# Patient Record
Sex: Female | Born: 1995
Health system: Southern US, Community
[De-identification: ages and names within clinical notes are randomized; demographics above are authoritative.]

---
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
## 2015-12-27 ENCOUNTER — Encounter: Payer: Self-pay | Admitting: Primary Care

## 2015-12-27 ENCOUNTER — Ambulatory Visit (INDEPENDENT_AMBULATORY_CARE_PROVIDER_SITE_OTHER): Payer: Managed Care, Other (non HMO) | Admitting: Primary Care

## 2015-12-27 ENCOUNTER — Encounter: Payer: Self-pay | Admitting: *Deleted

## 2015-12-27 VITALS — BP 122/76 | HR 80 | Temp 98.0°F | Ht 62.0 in | Wt 185.8 lb

## 2015-12-27 DIAGNOSIS — M791 Myalgia, unspecified site: Secondary | ICD-10-CM | POA: Insufficient documentation

## 2015-12-27 DIAGNOSIS — G8929 Other chronic pain: Secondary | ICD-10-CM

## 2015-12-27 DIAGNOSIS — M25512 Pain in left shoulder: Secondary | ICD-10-CM | POA: Diagnosis not present

## 2015-12-27 LAB — COMPREHENSIVE METABOLIC PANEL
ALBUMIN: 4.6 g/dL (ref 3.5–5.2)
ALT: 36 U/L — ABNORMAL HIGH (ref 0–35)
AST: 24 U/L (ref 0–37)
Alkaline Phosphatase: 54 U/L (ref 39–117)
BUN: 15 mg/dL (ref 6–23)
CALCIUM: 9.7 mg/dL (ref 8.4–10.5)
CHLORIDE: 104 meq/L (ref 96–112)
CO2: 25 mEq/L (ref 19–32)
Creatinine, Ser: 0.79 mg/dL (ref 0.40–1.20)
GFR: 96.81 mL/min (ref >60.00)
Glucose, Bld: 110 mg/dL — ABNORMAL HIGH (ref 70–99)
POTASSIUM: 4 meq/L (ref 3.5–5.1)
Sodium: 139 mEq/L (ref 135–145)
Total Bilirubin: 0.4 mg/dL (ref 0.2–1.2)
Total Protein: 7.9 g/dL (ref 6.0–8.3)

## 2015-12-27 NOTE — Progress Notes (Signed)
Pre visit review using our clinic review tool, if applicable. No additional management support is needed unless otherwise documented below in the visit note. 

## 2015-12-27 NOTE — Patient Instructions (Signed)
You will be contacted regarding your MRI.  Please let us know if you have not heard back within one week.   Complete lab work prior to leaving today. I will notify you of your results once received.   Please schedule a physical with me in 6 months. You may also schedule a lab only appointment 3-4 days prior. We will discuss your lab results in detail during your physical.  It was a pleasure to meet you today! Please don't hesitate to call me with any questions. Welcome to [facility]!

## 2015-12-27 NOTE — Progress Notes (Signed)
Subjective:    Patient ID: Ashlee Cross, female    DOB: 1996-03-31, 20 y.o.   MRN: [id number]  HPI  Ms. Ashlee Cross is a 20 year old female who presents today to establish care and discuss the problems mentioned below. Will obtain old records.  1) Myalgias: Located to the right side of her thoracic back. Two years ago she experienced this discomfort every 3 months with any twisting or certain movement of her thoracic back. January of 2016 she noticed this discomfort occuring once monthly. She cannot replicate her discomfort with any movement.   Over the past several months she's noticed her discomfort more frequently. Each time she has this discomfort she will experience soreness to her muscle for several hours. Her discomfort does not occur with her menstrual cycle. Denies numbness/tingling, recent injury or trauma. She describes her discomfort as a "shock-like" pain.  2) Shoulder Injury: Located to left shoulder for the past 5-6 years. Competed in swimming, tennis, and golf during high school which is when she started to notice her pain. She was diagnosed with supraspinatus strain in high school. She completed physical therapy about 5 years ago with temporary improvement until her insurance no longer covered treatment. She has been frustrated recently as she is trying to work out and exercise, but cannot without pain and weakness. She has a history of hyperextension to her knees.   Review of Systems  Respiratory: Negative for shortness of breath.   Cardiovascular: Negative for chest pain.  Musculoskeletal: Positive for myalgias.       Left shoulder weakness and pain  Neurological: Negative for numbness.       No past medical history on file.   Social History   Social History  . Marital Status: Single    Spouse Name: N/A  . Number of Children: N/A  . Years of Education: N/A   Occupational History  . Not on file.   Social History Main Topics  . Smoking status: Never Smoker   .  Smokeless tobacco: Not on file  . Alcohol Use: 0.0 oz/week    0 Standard drinks or equivalent per week  . Drug Use: No  . Sexual Activity: Not on file   Other Topics Concern  . Not on file   Social History Narrative   Single.   Bachelor's degree in Bioengineering.    Plans on attending PT school in the Fall of 2018.   [place] spending time with friends, [place]    Past Surgical History  Procedure Laterality Date  . Wisdom tooth extraction  2014    Family History  Problem Relation Age of Onset  . Hyperlipidemia Father   . Hyperlipidemia Mother     Allergies  Allergen Reactions  . Vicodin [Hydrocodone-Acetaminophen] Nausea And Vomiting    No current outpatient prescriptions on file prior to visit.   No current facility-administered medications on file prior to visit.    BP 122/76 mmHg  Pulse 80  Temp(Src) 98 F (36.7 C) (Oral)  Ht 5\' 2"  (1.575 m)  Wt 185 lb 12.8 oz (84.278 kg)  BMI 33.97 kg/m2  SpO2 98%  LMP 12/25/2015    Objective:   Physical Exam  Constitutional: She appears well-nourished.  Neck: Neck supple.  Cardiovascular: Normal rate and regular rhythm.   Pulmonary/Chest: Effort normal and breath sounds normal.  Musculoskeletal:       Left shoulder: She exhibits pain and decreased strength. She exhibits normal range of motion, no tenderness  and no crepitus.  Pain and weakness with any resistance to left shoulder  Skin: Skin is warm and dry.  Psychiatric: She has a normal mood and affect.          Assessment & Plan:

## 2015-12-27 NOTE — Assessment & Plan Note (Signed)
Present for 5-6 years with pain and weakness. Temporary improvement with PT, symptoms returned soon after completion. Good ROM today. Given duration of symptoms with weakness with proceed with MRI for further evaluation. Highly doubt xray will be enough for evaluation. MRI order placed.

## 2015-12-27 NOTE — Assessment & Plan Note (Signed)
Located to right side of thoracic back. Non reproducible today.  Suspect nerve involvement that may or may not be related to left shoulder complaint. Will obtain CBP today to rule out any electrolyte abnormality.

## 2016-01-10 ENCOUNTER — Telehealth: Payer: Self-pay | Admitting: Primary Care

## 2016-01-10 DIAGNOSIS — M25512 Pain in left shoulder: Principal | ICD-10-CM

## 2016-01-10 DIAGNOSIS — G8929 Other chronic pain: Secondary | ICD-10-CM

## 2016-01-10 NOTE — Telephone Encounter (Signed)
Please notify patient that her insurance has denied coverage for the MRI that we ordered. She will need to undergo an x-ray before her insurance will decide to pay for the MRI. I have ordered the x-ray so she may come at her convenience for completion. Revonda Standardllison please cancel the current MRI order for now.

## 2016-01-11 ENCOUNTER — Other Ambulatory Visit: Payer: Self-pay | Admitting: Primary Care

## 2016-01-11 ENCOUNTER — Ambulatory Visit (INDEPENDENT_AMBULATORY_CARE_PROVIDER_SITE_OTHER)
Admission: RE | Admit: 2016-01-11 | Discharge: 2016-01-11 | Disposition: A | Discharge status: Home / Self Care | Payer: Managed Care, Other (non HMO) | Source: Ambulatory Visit | Attending: Primary Care | Admitting: Primary Care

## 2016-01-11 DIAGNOSIS — M25512 Pain in left shoulder: Secondary | ICD-10-CM

## 2016-01-11 DIAGNOSIS — G8929 Other chronic pain: Secondary | ICD-10-CM

## 2016-01-11 NOTE — Telephone Encounter (Signed)
MRI has been cancelled that was on 01/19/16.

## 2016-01-11 NOTE — Telephone Encounter (Signed)
Pt is notified MRI cancelled and will come in for xray today or tomorrow

## 2016-01-19 ENCOUNTER — Ambulatory Visit: Payer: Managed Care, Other (non HMO)

## 2016-02-02 ENCOUNTER — Ambulatory Visit
Admission: RE | Admit: 2016-02-02 | Discharge: 2016-02-02 | Disposition: A | Discharge status: Home / Self Care | Payer: Managed Care, Other (non HMO) | Source: Ambulatory Visit | Attending: Primary Care | Admitting: Primary Care

## 2016-02-02 ENCOUNTER — Other Ambulatory Visit: Payer: Self-pay | Admitting: Primary Care

## 2016-02-02 DIAGNOSIS — M25512 Pain in left shoulder: Principal | ICD-10-CM

## 2016-02-02 DIAGNOSIS — G8929 Other chronic pain: Secondary | ICD-10-CM

## 2016-02-02 DIAGNOSIS — M7552 Bursitis of left shoulder: Secondary | ICD-10-CM | POA: Diagnosis not present

## 2016-02-10 ENCOUNTER — Encounter: Payer: Self-pay | Admitting: *Deleted

## 2016-02-20 ENCOUNTER — Encounter: Payer: Self-pay | Admitting: Obstetrics & Gynecology

## 2016-02-20 ENCOUNTER — Ambulatory Visit (INDEPENDENT_AMBULATORY_CARE_PROVIDER_SITE_OTHER): Payer: Managed Care, Other (non HMO) | Admitting: Obstetrics & Gynecology

## 2016-02-20 VITALS — BP 115/75 | HR 98 | Resp 18 | Ht 62.0 in | Wt 182.0 lb

## 2016-02-20 DIAGNOSIS — Z3041 Encounter for surveillance of contraceptive pills: Secondary | ICD-10-CM

## 2016-02-20 DIAGNOSIS — N946 Dysmenorrhea, unspecified: Secondary | ICD-10-CM | POA: Diagnosis not present

## 2016-02-20 MED ORDER — VIENVA 0.1-20 MG-MCG PO TABS: 1.0000 | ORAL_TABLET | Freq: Every day | ORAL | 5 refills | Status: DC

## 2016-02-20 NOTE — Patient Instructions (Signed)
Return to clinic for any scheduled appointments or for any gynecologic concerns as needed.   

## 2016-02-20 NOTE — Progress Notes (Signed)
   CLINIC ENCOUNTER NOTE  History:  20 y.o. G0P0000 here today for OCP prescription and to establish care.  On OCPs continuously due to history of dysmenorrhea. Satisfied by method. She denies any abnormal vaginal discharge, bleeding, pelvic pain or other concerns.   No past medical history on file.  Past Surgical History:  Procedure Laterality Date  . WISDOM TOOTH EXTRACTION  2014    The following portions of the patient's history were reviewed and updated as appropriate: allergies, current medications, past family history, past medical history, past social history, past surgical history and problem list.   Health Maintenance:  Normal pap in 03/2015. Has received Gardasil series.   Review of Systems:  Pertinent items noted in HPI and remainder of comprehensive ROS otherwise negative.  Objective:  Physical Exam BP 115/75 (BP Location: Left Arm, Patient Position: Sitting, Cuff Size: Large)   Pulse 98   Resp 18   Ht 5\' 2"  (1.575 m)   Wt 182 lb (82.6 kg)   LMP 02/13/2016   BMI 33.29 kg/m  CONSTITUTIONAL: Well-developed, well-nourished female in no acute distress.  HENT:  Normocephalic, atraumatic. External right and left ear normal. Oropharynx is clear and moist EYES: Conjunctivae and EOM are normal. Pupils are equal, round, and reactive to light. No scleral icterus.  NECK: Normal range of motion, supple, no masses SKIN: Skin is warm and dry. No rash noted. Not diaphoretic. No erythema. No pallor. NEUROLOGIC: Alert and oriented to person, place, and time. Normal reflexes, muscle tone coordination. No cranial nerve deficit noted. PSYCHIATRIC: Normal mood and affect. Normal behavior. Normal judgment and thought content. CARDIOVASCULAR: Normal heart rate noted RESPIRATORY: Effort and breath sounds normal, no problems with respiration noted ABDOMEN: Soft, no distention noted.   PELVIC: Deferred MUSCULOSKELETAL: Normal range of motion. No edema noted.   Assessment & Plan:  1.  Dysmenorrhea 2. Oral contraceptive use OCPs prescribed.   - VIENVA 0.1-20 MG-MCG tablet; Take 1 tablet by mouth daily. Take active pills continously.  Dispense: 4 Package; Refill: 5 Routine preventative health maintenance measures emphasized. Please refer to After Visit Summary for other counseling recommendations.   Total face-to-face time with patient: 20 minutes. Over 50% of encounter was spent on counseling and coordination of care.   Jaynie CollinsUGONNA  Ermine Stebbins, MD, FACOG Attending Obstetrician & Gynecologist, [facility], [facility]

## 2016-05-28 ENCOUNTER — Encounter: Payer: Self-pay | Admitting: Primary Care

## 2016-05-28 ENCOUNTER — Ambulatory Visit (INDEPENDENT_AMBULATORY_CARE_PROVIDER_SITE_OTHER): Payer: Managed Care, Other (non HMO) | Admitting: Primary Care

## 2016-05-28 VITALS — BP 116/74 | HR 93 | Temp 97.8°F | Ht 62.0 in | Wt 176.1 lb

## 2016-05-28 DIAGNOSIS — Z0000 Encounter for general adult medical examination without abnormal findings: Secondary | ICD-10-CM

## 2016-05-28 DIAGNOSIS — G8929 Other chronic pain: Secondary | ICD-10-CM

## 2016-05-28 DIAGNOSIS — M25512 Pain in left shoulder: Secondary | ICD-10-CM

## 2016-05-28 LAB — LIPID PANEL
CHOL/HDL RATIO: 6
Cholesterol: 262 mg/dL — ABNORMAL HIGH (ref 0–200)
HDL: 44.4 mg/dL (ref >39.00)
LDL Cholesterol: 179 mg/dL — ABNORMAL HIGH (ref 0–99)
NONHDL: 217.65
Triglycerides: 194 mg/dL — ABNORMAL HIGH (ref 0.0–149.0)
VLDL: 38.8 mg/dL (ref 0.0–40.0)

## 2016-05-28 LAB — COMPREHENSIVE METABOLIC PANEL
ALT: 14 U/L (ref 0–35)
AST: 13 U/L (ref 0–37)
Albumin: 4.3 g/dL (ref 3.5–5.2)
Alkaline Phosphatase: 54 U/L (ref 39–117)
BILIRUBIN TOTAL: 0.4 mg/dL (ref 0.2–1.2)
BUN: 14 mg/dL (ref 6–23)
CO2: 27 meq/L (ref 19–32)
CREATININE: 0.76 mg/dL (ref 0.40–1.20)
Calcium: 9.3 mg/dL (ref 8.4–10.5)
Chloride: 104 mEq/L (ref 96–112)
GFR: 100.84 mL/min (ref >60.00)
GLUCOSE: 86 mg/dL (ref 70–99)
Potassium: 4 mEq/L (ref 3.5–5.1)
SODIUM: 138 meq/L (ref 135–145)
Total Protein: 7.4 g/dL (ref 6.0–8.3)

## 2016-05-28 NOTE — Assessment & Plan Note (Signed)
Immunizations UTD. Pap UTD, normal, follows with GYN. Weight loss of 9 pounds, commended her on this success. Continue exercise and diet. Exam stable. Labs pending. Declines STD testing. Follow up in 1 year for repeat physical.

## 2016-05-28 NOTE — Progress Notes (Signed)
Pre visit review using our clinic review tool, if applicable. No additional management support is needed unless otherwise documented below in the visit note. 

## 2016-05-28 NOTE — Assessment & Plan Note (Signed)
Continues to experience pain. Following with ortho for impingement and tendonopathy of left shoulder. Little to no improvement with last joint injection.  She will see ortho in late 2017.

## 2016-05-28 NOTE — Patient Instructions (Signed)
Complete lab work prior to leaving today. I will notify you of your results once received.   Continue exercising. You should be getting 150 minutes of moderate intensity exercise weekly.  Congratulations on your weight loss! Continue to work on consuming enough vegetables, fruit, whole grains.  Ensure you are drinking 64 ounces of water daily.  Follow up with your orthopedist as discussed.  Follow up with me in 1 year for annual exam or sooner if needed.  It was a pleasure to see you today!

## 2016-05-28 NOTE — Progress Notes (Signed)
Subjective:    Patient ID: Ashlee Cross, female    DOB: 01/24/1996, 20 y.o.   MRN: [id number]  HPI  [name] is a 20 year old female who presents today for complete physical.  Immunizations: -Tetanus: Completed in 2017 -Influenza: Completed this season.   Diet: She endorses a healthy diet. Breakfast: Atkins protein bar Lunch: Chicken salad, left overs, ham sandwich Dinner: Meat, vegetables Snacks: Fruit and peanut butter, cheese, nuts Desserts: Sugar free jellow Beverages: Water, coffee  Exercise: She exercises at the gym 4 times weekly. Eye exam: Completed in Summer 2017. Dental exam: Completed in August 2017. Pap Smear: Completed in September 2016, normal.  Wt Readings from Last 3 Encounters:  05/28/16 176 lb 1.9 oz (79.9 kg)  02/20/16 182 lb (82.6 kg)  12/27/15 185 lb 12.8 oz (84.3 kg)      Review of Systems  Constitutional: Negative for unexpected weight change.  HENT: Negative for rhinorrhea.   Respiratory: Negative for cough and shortness of breath.   Cardiovascular: Negative for chest pain.  Gastrointestinal: Negative for constipation and diarrhea.  Genitourinary: Negative for difficulty urinating and menstrual problem.  Musculoskeletal: Negative for arthralgias and myalgias.       Completed physical therapy and steroid injections to her left shoulder. Currently following with Ortho.   Skin: Negative for rash.  Allergic/Immunologic: Negative for environmental allergies.  Neurological: Negative for dizziness, numbness and headaches.  Psychiatric/Behavioral:       Denies concerns for anxiety and depression       No past medical history on file.   Social History   Social History  . Marital status: Single    Spouse name: N/A  . Number of children: N/A  . Years of education: N/A   Occupational History  . Not on file.   Social History Main Topics  . Smoking status: Never Smoker  . Smokeless tobacco: Never Used  . Alcohol use 0.6 oz/week   1 Glasses of wine per week  . Drug use: No  . Sexual activity: No   Other Topics Concern  . Not on file   Social History Narrative   Single.   Bachelor's degree in Bioengineering.    Plans on attending PT school in the Fall of 2018.   Enjo[place] spending time with friends, [place], [place].    Past Surgical History:  Procedure Laterality Date  . WISDOM TOOTH EXTRACTION  2014    Family History  Problem Relation Age of Onset  . Hyperlipidemia Father   . Hyperlipidemia Mother   . Cancer Mother     squamous cell carcinoma    Allergies  Allergen Reactions  . Vicodin [Hydrocodone-Acetaminophen] Nausea And Vomiting    Current Outpatient Prescriptions on File Prior to Visit  Medication Sig Dispense Refill  . VIENVA 0.1-20 MG-MCG tablet Take 1 tablet by mouth daily. Take active pills continously. 4 Package 5  . meloxicam (MOBIC) 15 MG tablet Take 15 mg by mouth daily.     No current facility-administered medications on file prior to visit.     BP 116/74   Pulse 93   Temp 97.8 F (36.6 C) (Oral)   Ht 5\' 2"  (1.575 m)   Wt 176 lb 1.9 oz (79.9 kg)   SpO2 93%   BMI 32.21 kg/m    Objective:   Physical Exam  Constitutional: She is oriented to person, place, and time. She appears well-nourished.  HENT:  Right Ear: Tympanic membrane and ear canal normal.  Left Ear: Tympanic membrane and ear canal normal.  Nose: Nose normal.  Mouth/Throat: Oropharynx is clear and moist.  Eyes: Conjunctivae and EOM are normal. Pupils are equal, round, and reactive to light.  Neck: Neck supple. No thyromegaly present.  Cardiovascular: Normal rate and regular rhythm.   No murmur heard. Pulmonary/Chest: Effort normal and breath sounds normal. She has no rales.  Abdominal: Soft. Bowel sounds are normal. There is no tenderness.  Musculoskeletal: Normal range of motion.  Some discomfort with abduction of left shoulder. Good strength bilaterally.   Lymphadenopathy:    She has no  cervical adenopathy.  Neurological: She is alert and oriented to person, place, and time. She has normal reflexes. No cranial nerve deficit.  Skin: Skin is warm and dry. No rash noted.  Psychiatric: She has a normal mood and affect.          Assessment & Plan:

## 2016-07-18 ENCOUNTER — Telehealth (HOSPITAL_COMMUNITY): Payer: Self-pay

## 2016-07-18 NOTE — Telephone Encounter (Signed)
Pt states her schedule is crazy now and she will call us back to reschedule

## 2016-07-19 ENCOUNTER — Ambulatory Visit (HOSPITAL_COMMUNITY): Payer: Managed Care, Other (non HMO)

## 2016-08-10 ENCOUNTER — Encounter: Payer: Self-pay | Admitting: Primary Care

## 2016-08-13 ENCOUNTER — Encounter: Payer: Self-pay | Admitting: Primary Care

## 2016-08-13 ENCOUNTER — Ambulatory Visit (INDEPENDENT_AMBULATORY_CARE_PROVIDER_SITE_OTHER): Payer: Managed Care, Other (non HMO) | Admitting: Primary Care

## 2016-08-13 VITALS — BP 124/84 | HR 84 | Temp 98.1°F | Ht 62.0 in | Wt 185.1 lb

## 2016-08-13 DIAGNOSIS — R002 Palpitations: Secondary | ICD-10-CM

## 2016-08-13 DIAGNOSIS — R450 Nervousness: Secondary | ICD-10-CM | POA: Insufficient documentation

## 2016-08-13 LAB — CBC
HEMATOCRIT: 41.7 % (ref 36.0–46.0)
HEMOGLOBIN: 14.4 g/dL (ref 12.0–15.0)
MCHC: 34.5 g/dL (ref 30.0–36.0)
MCV: 90.9 fl (ref 78.0–100.0)
Platelets: 234 10*3/uL (ref 150.0–400.0)
RBC: 4.59 Mil/uL (ref 3.87–5.11)
RDW: 12.3 % (ref 11.5–15.5)
WBC: 7.3 10*3/uL (ref 4.0–10.5)

## 2016-08-13 LAB — BASIC METABOLIC PANEL
BUN: 10 mg/dL (ref 6–23)
CALCIUM: 9.3 mg/dL (ref 8.4–10.5)
CO2: 24 mEq/L (ref 19–32)
Chloride: 104 mEq/L (ref 96–112)
Creatinine, Ser: 0.75 mg/dL (ref 0.40–1.20)
GFR: 102.2 mL/min (ref >60.00)
GLUCOSE: 111 mg/dL — AB (ref 70–99)
Potassium: 4 mEq/L (ref 3.5–5.1)
Sodium: 137 mEq/L (ref 135–145)

## 2016-08-13 LAB — POCT GLUCOSE (DEVICE FOR HOME USE): POC Glucose: 140 mg/dl — AB (ref 70–99)

## 2016-08-13 LAB — HEMOGLOBIN A1C: Hgb A1c MFr Bld: 5.1 % (ref 4.6–6.5)

## 2016-08-13 LAB — TSH: TSH: 1.9 u[IU]/mL (ref 0.35–4.50)

## 2016-08-13 NOTE — Assessment & Plan Note (Signed)
Present for the past 2 years, more frequent over the past 1 year. Symptoms sound like episodes of hypoglycemia, however, CBG in the clinic today of 140. She ate a bagel 2 hours before her visit today. Check TSH, CBC, BMP, A1c. Discussed to eat small frequent meals, chose lower calorie options. Also discussed to ensure she is taking enough water as it doesn't sound like she is. Will await lab results, POCT glucose test today reassuring.

## 2016-08-13 NOTE — Progress Notes (Signed)
Subjective:    Patient ID: Ashlee Cross, female    DOB: Aug 07, 1995, 21 y.o.   MRN: [id number]  HPI  Ashlee Cross is a 21 year old female who presents today with a chief complaint of feeling jittery. She will experience episodes of symptoms including palpitations, shaky hands, generalized weakness, feeling warm, and jittery feeling that occur daily.  She will feel improvement and resolve in symptoms within 30 minutes to 1 hour after eating (peanut butter, cheese, peanuts). If she doesn't eat routinely then she will experience these episodes numerous times daily. If she doesn't respond to these symptoms by feeding herself feed herself then she'll experience dizziness and lightheadedness.  She's now experiencing anxiety and is always thinking about whether an episode will occur.  She has a family history of hypoglycemia in her mother. She's currently following with orthopedics and has received two rounds of steroid injections and one course of oral steroids within the past several months. She continues to experience these symptoms despite improvements in her diet. Her symptoms have been present for the past 2 years but has become more frequent over the past 1 year. She denies feeling stressed, anxious, depressed. She is worried she may have hypoglycemic episodes as her mother does. She is experiencing these symptoms now.  Review of Systems  Constitutional: Negative for fever and unexpected weight change.  Respiratory: Negative for shortness of breath.   Cardiovascular: Positive for palpitations. Negative for chest pain.  Gastrointestinal: Negative for abdominal pain.  Genitourinary: Negative for menstrual problem and vaginal bleeding.  Neurological: Negative for dizziness.       No past medical history on file.   Social History   Social History  . Marital status: Single    Spouse name: N/A  . Number of children: N/A  . Years of education: N/A   Occupational History  . Not on file.    Social History Main Topics  . Smoking status: Never Smoker  . Smokeless tobacco: Never Used  . Alcohol use 0.6 oz/week    1 Glasses of wine per week  . Drug use: No  . Sexual activity: No   Other Topics Concern  . Not on file   Social History Narrative   Single.   Bachelor's degree in Bioengineering.    Plans on attending PT school in the Fall of 2018.   Enjoys spending time with friends, going to [place], hiking.    Past Surgical History:  Procedure Laterality Date  . WISDOM TOOTH EXTRACTION  2014    Family History  Problem Relation Age of Onset  . Hyperlipidemia Father   . Hyperlipidemia Mother   . Cancer Mother     squamous cell carcinoma    Allergies  Allergen Reactions  . Vicodin [Hydrocodone-Acetaminophen] Nausea And Vomiting    Current Outpatient Prescriptions on File Prior to Visit  Medication Sig Dispense Refill  . VIENVA 0.1-20 MG-MCG tablet Take 1 tablet by mouth daily. Take active pills continously. 4 Package 5   No current facility-administered medications on file prior to visit.     BP 124/84   Pulse 84   Temp 98.1 F (36.7 C) (Oral)   Ht 5\' 2"  (1.575 m)   Wt 185 lb 1.9 oz (84 kg)   SpO2 98%   BMI 33.86 kg/m    Objective:   Physical Exam  Constitutional: She is oriented to person, place, and time. She appears well-nourished.  Neck: Neck supple. No thyromegaly present.  Cardiovascular: Normal rate  and regular rhythm.   Pulmonary/Chest: Effort normal and breath sounds normal.  Neurological: She is alert and oriented to person, place, and time.  Skin: Skin is warm and dry.          Assessment & Plan:

## 2016-08-13 NOTE — Progress Notes (Signed)
Pre visit review using our clinic review tool, if applicable. No additional management support is needed unless otherwise documented below in the visit note. 

## 2016-08-13 NOTE — Patient Instructions (Signed)
Complete lab work prior to leaving today. I will notify you of your results once received.   Your blood sugar today was 140 which is slightly high. This is likely due to the meal you ate several hours ago.  Ensure you are consuming 64 ounces of water daily.  It was a pleasure to see you today!

## 2016-10-19 ENCOUNTER — Telehealth: Payer: Self-pay | Admitting: Primary Care

## 2016-10-19 NOTE — Telephone Encounter (Signed)
She had a Tdap on 03/2016 so she doesn't need another tetanus vaccination until 2027.

## 2016-10-19 NOTE — Telephone Encounter (Signed)
Pt called to schedule td booster is it ok to schedule

## 2016-10-19 NOTE — Telephone Encounter (Signed)
Spoken and notified patient of Kate's comments. Patient verbalized understanding. 

## 2016-12-03 ENCOUNTER — Telehealth: Payer: Self-pay

## 2016-12-03 DIAGNOSIS — Z0184 Encounter for antibody response examination: Secondary | ICD-10-CM

## 2016-12-03 NOTE — Telephone Encounter (Signed)
I called [facility], they said to fax [facility] location.

## 2016-12-03 NOTE — Telephone Encounter (Signed)
Pt left v/m; [place]; School telling pt needs hep B titer and pt request an order for Hep B titer mailed to [facility]  [address] [place] [id number]. Pt request cb.

## 2016-12-03 NOTE — Telephone Encounter (Signed)
Ashlee Cross,  Is there anyway to do this electronically and release it to [facility] or does being in [facility] make a difference?

## 2016-12-04 ENCOUNTER — Encounter: Payer: Self-pay | Admitting: Primary Care

## 2016-12-04 NOTE — Addendum Note (Signed)
Addended by: Doreene NestLARK, KATHERINE K on: 12/04/2016 01:38 PM   Modules accepted: Orders

## 2016-12-04 NOTE — Telephone Encounter (Signed)
Jae DireKate, Do you want to write this on a script or would you prefer to print out a requisition and fax it?

## 2016-12-04 NOTE — Telephone Encounter (Signed)
Lab order placed in Epic, can you print and fax to [facility]?

## 2016-12-04 NOTE — Addendum Note (Signed)
Addended by: Tawnya CrookSAMBATH, Jearl Soto on: 12/04/2016 02:46 PM   Modules accepted: Orders

## 2016-12-04 NOTE — Telephone Encounter (Signed)
Order has been faxed to [facility], [address] [place] [id number] and fax number was [phone].  Notified patient this was done.

## 2016-12-07 LAB — HEPATITIS B SURFACE ANTIBODY, QUANTITATIVE: Hepatitis B Surf Ab Quant: >1000 m[IU]/mL (ref >9.9)

## 2016-12-11 ENCOUNTER — Telehealth: Payer: Self-pay | Admitting: Primary Care

## 2016-12-11 NOTE — Telephone Encounter (Signed)
Opened in error

## 2017-02-26 ENCOUNTER — Ambulatory Visit (INDEPENDENT_AMBULATORY_CARE_PROVIDER_SITE_OTHER): Payer: Managed Care, Other (non HMO) | Admitting: Family Medicine

## 2017-02-26 ENCOUNTER — Encounter: Payer: Self-pay | Admitting: Family Medicine

## 2017-02-26 VITALS — BP 145/83 | HR 120 | Ht 62.0 in | Wt 166.0 lb

## 2017-02-26 DIAGNOSIS — Z3009 Encounter for other general counseling and advice on contraception: Secondary | ICD-10-CM

## 2017-02-26 NOTE — Patient Instructions (Signed)

## 2017-02-26 NOTE — Progress Notes (Signed)
   Subjective:    Patient ID: Ashlee Cross is a 21 y.o. female presenting with Contraception  on 02/26/2017  HPI: On OCs for 5-6 years. She is wanting to change to Mirena. Very scared of getting pregnant.  Review of Systems  Constitutional: Negative for chills and fever.  Respiratory: Negative for shortness of breath.   Cardiovascular: Negative for chest pain.  Gastrointestinal: Negative for abdominal pain, nausea and vomiting.  Genitourinary: Negative for dysuria.  Skin: Negative for rash.      Objective:    BP (!) 145/83   Pulse (!) 120   Ht 5\' 2"  (1.575 m)   Wt 166 lb (75.3 kg)   BMI 30.36 kg/m  Physical Exam  Constitutional: She is oriented to person, place, and time. She appears well-developed and well-nourished. No distress.  HENT:  Head: Normocephalic and atraumatic.  Eyes: No scleral icterus.  Neck: Neck supple.  Cardiovascular: Normal rate.   Pulmonary/Chest: Effort normal.  Abdominal: Soft.  Neurological: She is alert and oriented to person, place, and time.  Skin: Skin is warm and dry.  Psychiatric: She has a normal mood and affect.        Assessment & Plan:   Problem List Items Addressed This Visit    None    Visit Diagnoses    Encounter for counseling regarding contraception    -  Primary   desires IUD, pros and cons discussed. Premedicate with Ibuprofen. Come in on her cycle.      Total face-to-face time with patient: 15 minutes. Over 50% of encounter was spent on counseling and coordination of care. Return in about 4 weeks (around 03/26/2017) for iud check.  Reva Boresanya S Pratt 02/26/2017 2:49 PM

## 2017-03-14 ENCOUNTER — Other Ambulatory Visit: Payer: Self-pay | Admitting: Obstetrics & Gynecology

## 2017-03-14 DIAGNOSIS — N946 Dysmenorrhea, unspecified: Secondary | ICD-10-CM

## 2017-03-14 DIAGNOSIS — Z3041 Encounter for surveillance of contraceptive pills: Secondary | ICD-10-CM

## 2017-07-01 ENCOUNTER — Encounter: Payer: Managed Care, Other (non HMO) | Admitting: Primary Care

## 2017-07-05 ENCOUNTER — Encounter: Payer: Self-pay | Admitting: Primary Care

## 2017-07-05 ENCOUNTER — Ambulatory Visit (INDEPENDENT_AMBULATORY_CARE_PROVIDER_SITE_OTHER): Payer: 59 | Admitting: Primary Care

## 2017-07-05 VITALS — BP 116/70 | HR 81 | Temp 98.2°F | Ht 62.0 in | Wt 167.5 lb

## 2017-07-05 DIAGNOSIS — G8929 Other chronic pain: Secondary | ICD-10-CM | POA: Diagnosis not present

## 2017-07-05 DIAGNOSIS — Z0000 Encounter for general adult medical examination without abnormal findings: Secondary | ICD-10-CM

## 2017-07-05 DIAGNOSIS — Z0001 Encounter for general adult medical examination with abnormal findings: Secondary | ICD-10-CM | POA: Diagnosis not present

## 2017-07-05 DIAGNOSIS — M25512 Pain in left shoulder: Secondary | ICD-10-CM | POA: Diagnosis not present

## 2017-07-05 LAB — LIPID PANEL
CHOLESTEROL: 204 mg/dL — AB (ref 0–200)
HDL: 42.2 mg/dL (ref >39.00)
LDL CALC: 142 mg/dL — AB (ref 0–99)
NonHDL: 161.94
TRIGLYCERIDES: 100 mg/dL (ref 0.0–149.0)
Total CHOL/HDL Ratio: 5
VLDL: 20 mg/dL (ref 0.0–40.0)

## 2017-07-05 LAB — COMPREHENSIVE METABOLIC PANEL
ALBUMIN: 4.4 g/dL (ref 3.5–5.2)
ALK PHOS: 78 U/L (ref 39–117)
ALT: 14 U/L (ref 0–35)
AST: 14 U/L (ref 0–37)
BUN: 15 mg/dL (ref 6–23)
CALCIUM: 9.1 mg/dL (ref 8.4–10.5)
CHLORIDE: 102 meq/L (ref 96–112)
CO2: 30 mEq/L (ref 19–32)
CREATININE: 0.71 mg/dL (ref 0.40–1.20)
GFR: 108.02 mL/min (ref >60.00)
Glucose, Bld: 91 mg/dL (ref 70–99)
POTASSIUM: 4.3 meq/L (ref 3.5–5.1)
SODIUM: 136 meq/L (ref 135–145)
TOTAL PROTEIN: 7.4 g/dL (ref 6.0–8.3)
Total Bilirubin: 0.5 mg/dL (ref 0.2–1.2)

## 2017-07-05 NOTE — Assessment & Plan Note (Signed)
Immunizations UTD. Pap smear UTD. Commended her on weight loss, encouraged her to continue. Exam unremarkable. Labs pending. Follow up in 1 year.

## 2017-07-05 NOTE — Progress Notes (Signed)
Subjective:    Patient ID: Ashlee Cross, female    DOB: 10-02-1995, 21 y.o.   MRN: [id number]  HPI  [name] is a 21 year old female who presents today for complete physical.  Immunizations: -Tetanus: Completed in 2017 -Influenza: Completed in October 2018   Diet: She endorses a healthy diet. Breakfast: Peanut butter toast Lunch: Rice, quinoa, chicken, black beans, vegetables Dinner: Meat, vegetables, rice, sweet potatoes Snacks: Yogurt, nuts, cheese stick, fruit with peanut butter Desserts: Sugar free jello, strawberries Beverages: Water, coffee  Exercise: She is exercising 5-6 days weekly Eye exam: Completed December 2018 Dental exam: Completes semi-annually Pap Smear: Completed in 2016, negative, follows with GYN.  Wt Readings from Last 3 Encounters:  07/05/17 167 lb 8 oz (76 kg)  02/26/17 166 lb (75.3 kg)  08/13/16 185 lb 1.9 oz (84 kg)      Review of Systems  Constitutional: Negative for unexpected weight change.  HENT: Negative for rhinorrhea.   Respiratory: Negative for cough and shortness of breath.   Cardiovascular: Negative for chest pain.  Gastrointestinal: Negative for constipation and diarrhea.  Genitourinary: Negative for difficulty urinating and menstrual problem.  Musculoskeletal: Negative for arthralgias and myalgias.  Skin: Negative for rash.  Allergic/Immunologic: Negative for environmental allergies.  Neurological: Negative for dizziness, numbness and headaches.  Psychiatric/Behavioral:       Denies concerns for anxiety and depression       No past medical history on file.   Social History   Socioeconomic History  . Marital status: Single    Spouse name: Not on file  . Number of children: Not on file  . Years of education: Not on file  . Highest education level: Not on file  Social Needs  . Financial resource strain: Not on file  . Food insecurity - worry: Not on file  . Food insecurity - inability: Not on file  .  Transportation needs - medical: Not on file  . Transportation needs - non-medical: Not on file  Occupational History  . Not on file  Tobacco Use  . Smoking status: Never Smoker  . Smokeless tobacco: Never Used  Substance and Sexual Activity  . Alcohol use: Yes    Alcohol/week: 0.6 oz    Types: 1 Glasses of wine per week  . Drug use: No  . Sexual activity: No    Birth control/protection: Pill, Abstinence  Other Topics Concern  . Not on file  Social History Narrative   Single.   Bachelor's degree in Bioengineering.    Plans on attending PT school in the Fall of 2018.   Enjo[place] spending time with friends, [place]    Past Surgical History:  Procedure Laterality Date  . WISDOM TOOTH EXTRACTION  2014    Family History  Problem Relation Age of Onset  . Hyperlipidemia Father   . Hyperlipidemia Mother   . Cancer Mother        squamous cell carcinoma    Allergies  Allergen Reactions  . Vicodin [Hydrocodone-Acetaminophen] Nausea And Vomiting    Current Outpatient Medications on File Prior to Visit  Medication Sig Dispense Refill  . levonorgestrel (MIRENA) 20 MCG/24HR IUD 1 each by Intrauterine route once.     No current facility-administered medications on file prior to visit.     BP 116/70   Pulse 81   Temp 98.2 F (36.8 C) (Oral)   Ht 5\' 2"  (1.575 m)   Wt 167 lb 8 oz (76 kg)  LMP 06/29/2017   SpO2 98%   BMI 30.64 kg/m    Objective:   Physical Exam  Constitutional: She is oriented to person, place, and time. She appears well-nourished.  HENT:  Right Ear: Tympanic membrane and ear canal normal.  Left Ear: Tympanic membrane and ear canal normal.  Nose: Nose normal.  Mouth/Throat: Oropharynx is clear and moist.  Eyes: Conjunctivae and EOM are normal. Pupils are equal, round, and reactive to light.  Neck: Neck supple. No thyromegaly present.  Cardiovascular: Normal rate and regular rhythm.  No murmur heard. Pulmonary/Chest: Effort normal  and breath sounds normal. She has no rales.  Abdominal: Soft. Bowel sounds are normal. There is no tenderness.  Musculoskeletal: Normal range of motion.  Lymphadenopathy:    She has no cervical adenopathy.  Neurological: She is alert and oriented to person, place, and time. She has normal reflexes. No cranial nerve deficit.  Skin: Skin is warm and dry. No rash noted.  Psychiatric: She has a normal mood and affect.          Assessment & Plan:

## 2017-07-05 NOTE — Assessment & Plan Note (Signed)
Overall improved, following with PT through her school.

## 2017-07-05 NOTE — Patient Instructions (Signed)
Stop by the lab prior to leaving today. I will notify you of your results once received.   Continue exercising. You should be getting 150 minutes of moderate intensity exercise weekly.  Continue working on a healthy diet.  Ensure you are consuming 64 ounces of water daily.  Follow up in 1 year for your annual exam or sooner if needed.  It was a pleasure to see you today!

## 2017-07-12 ENCOUNTER — Telehealth: Payer: Self-pay

## 2017-07-12 ENCOUNTER — Ambulatory Visit: Payer: 59 | Admitting: Nurse Practitioner

## 2017-07-12 NOTE — Telephone Encounter (Signed)
Pete-Plz see below/FYI/thx dmf   Copied from CRM [phone]. Topic: General - Other >> Jul 12, 2017  9:17 AM Jolayne Hainesaylor, Brittany L wrote: Reason for CRM:  Pt is coming for an acute today to see [facility] @ 4:15pm from the [place] location

## 2017-07-12 NOTE — Telephone Encounter (Signed)
pt just made this am, lives in [place], going to [employer] over there.  Too far to come.

## 2017-12-10 ENCOUNTER — Encounter: Payer: Self-pay | Admitting: Primary Care

## 2017-12-19 IMAGING — MR MR SHOULDER*L* W/O CM
5 series · 40 of 40 positions shown · non-contrast
Comparison: 01/11/2016

CLINICAL DATA: Left shoulder pain and weakness. Duration: 1-2
months.

EXAM:
MRI OF THE LEFT SHOULDER WITHOUT CONTRAST
TECHNIQUE: Multiplanar, multisequence MR imaging of the shoulder was performed.
No intravenous contrast was administered.

[Series 3: T2 fat-sat · axial · 4.0mm · 0.47mm/px · z∈[-18,+70]mm · 8 of 21 slices shown (1 of 3)]
[im 1/21]
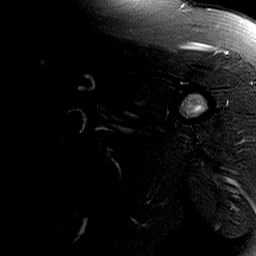
[im 3/21]
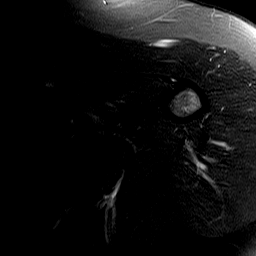
[im 6/21]
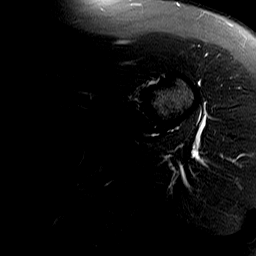
[im 9/21]
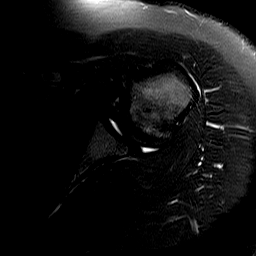
[im 12/21]
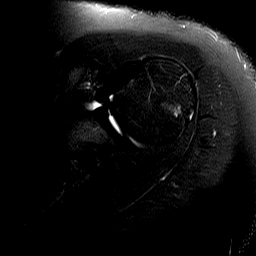
[im 15/21]
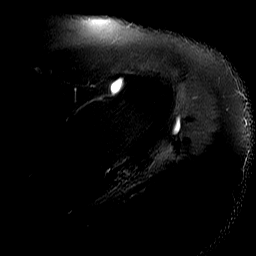
[im 18/21]
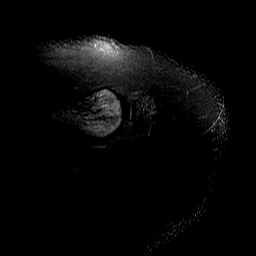
[im 21/21]
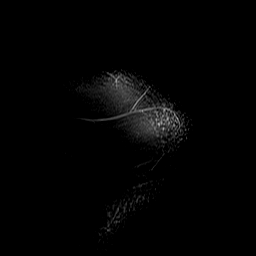

[Series 4: T2 fat-sat · oblique · 4.0mm · 0.62mm/px · 8 of 19 slices shown (2 of 3)]
[im 1/19]
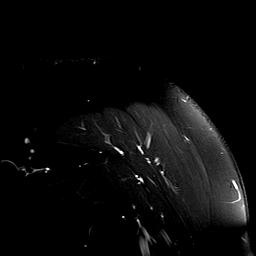
[im 3/19]
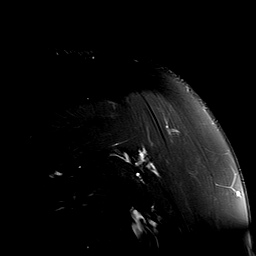
[im 6/19]
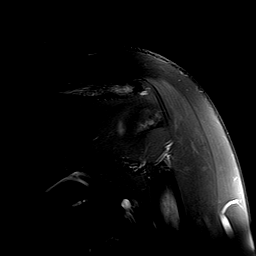
[im 8/19]
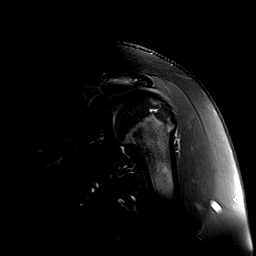
[im 11/19]
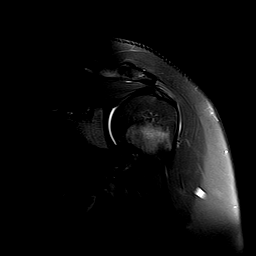
[im 13/19]
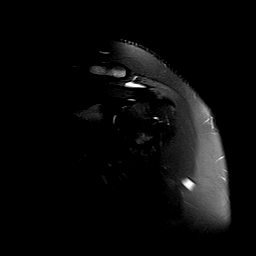
[im 16/19]
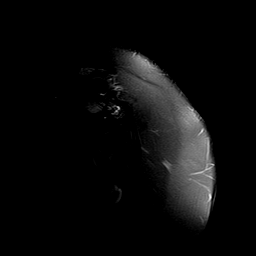
[im 19/19]
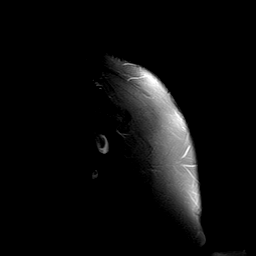

[Series 5: PD · oblique · 4.0mm · 0.62mm/px · 8 of 19 slices shown]
[im 1/19]
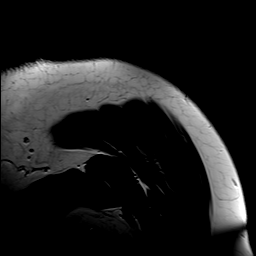
[im 3/19]
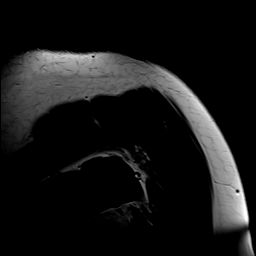
[im 6/19]
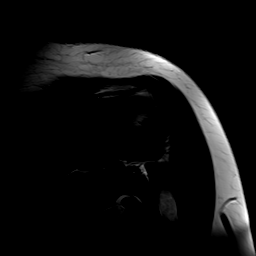
[im 8/19]
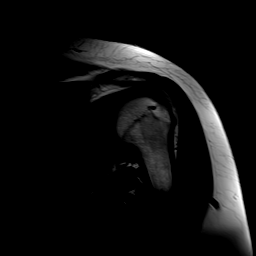
[im 11/19]
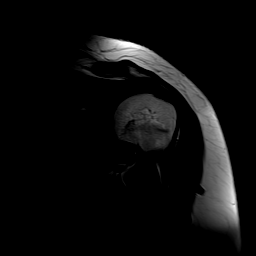
[im 13/19]
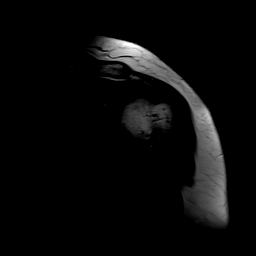
[im 16/19]
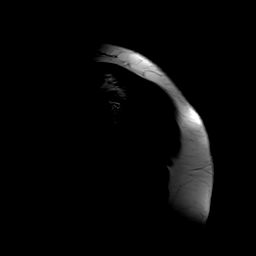
[im 19/19]
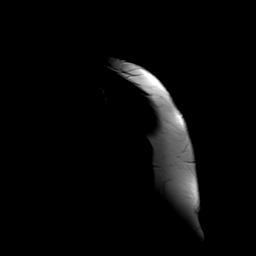

[Series 6: T1 · oblique · 4.0mm · 0.62mm/px · 8 of 19 slices shown]
[im 1/19]
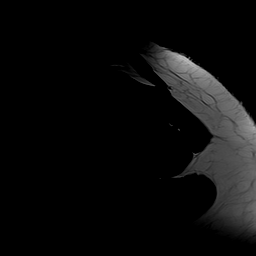
[im 3/19]
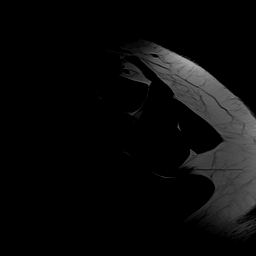
[im 6/19]
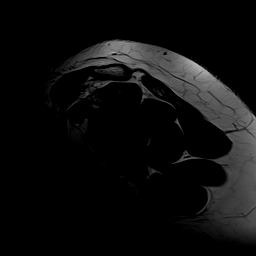
[im 8/19]
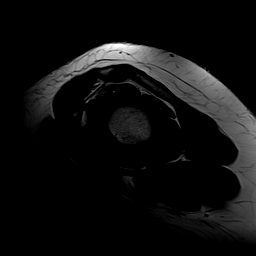
[im 11/19]
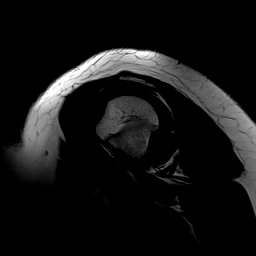
[im 13/19]
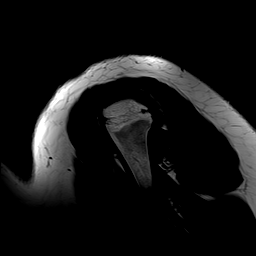
[im 16/19]
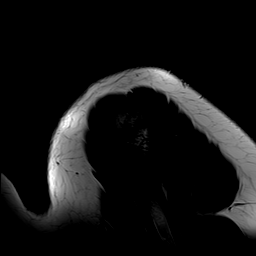
[im 19/19]
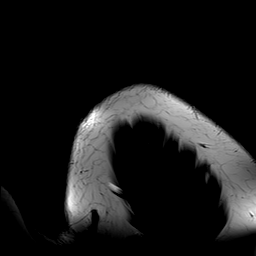

[Series 7: T2 fat-sat · oblique · 4.0mm · 0.62mm/px · 8 of 19 slices shown (3 of 3)]
[im 1/19]
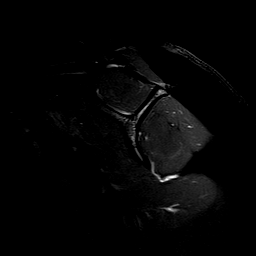
[im 3/19]
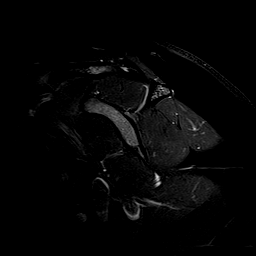
[im 6/19]
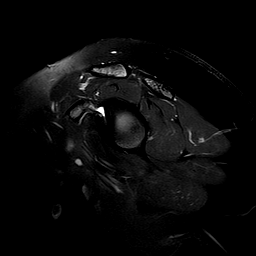
[im 8/19]
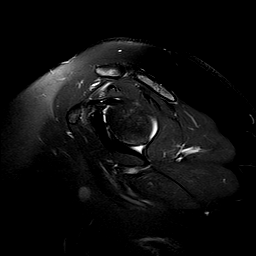
[im 11/19]
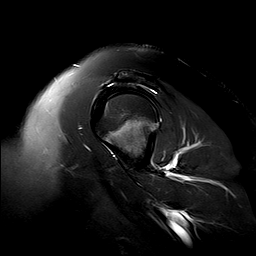
[im 13/19]
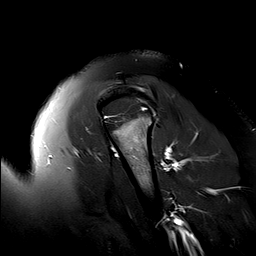
[im 16/19]
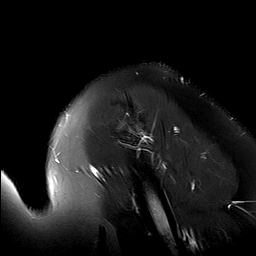
[im 19/19]
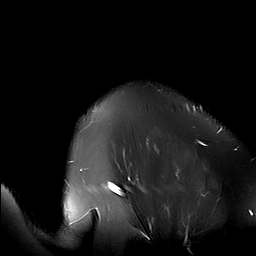

[40 of 40 positions shown; findings below may reference images not displayed]

FINDINGS: Rotator cuff:  Minimal supraspinatus tendinopathy.

Muscles:  Unremarkable

Biceps long head:  Unremarkable

Acromioclavicular Joint: No arthropathy. Subacromial morphology is
type 2 (curved). Small but abnormal amount of fluid in the
subacromial subdeltoid bursa.

Glenohumeral Joint: Unremarkable

Labrum:  Grossly unremarkable

Bones: No significant extra-articular osseous abnormalities
identified.

Other: Multiple thin axillary lymph nodes with fatty hila.
IMPRESSION: 1. Mild subacromial subdeltoid bursitis.
2. Minimal supraspinatus tendinopathy.

## 2018-06-27 ENCOUNTER — Other Ambulatory Visit: Payer: Self-pay | Admitting: Primary Care

## 2018-06-27 DIAGNOSIS — Z0000 Encounter for general adult medical examination without abnormal findings: Secondary | ICD-10-CM

## 2018-07-02 ENCOUNTER — Encounter: Payer: 59 | Admitting: Primary Care

## 2018-07-04 ENCOUNTER — Other Ambulatory Visit (INDEPENDENT_AMBULATORY_CARE_PROVIDER_SITE_OTHER): Payer: 59

## 2018-07-04 DIAGNOSIS — Z0000 Encounter for general adult medical examination without abnormal findings: Secondary | ICD-10-CM | POA: Diagnosis not present

## 2018-07-04 LAB — LIPID PANEL
CHOL/HDL RATIO: 5
CHOLESTEROL: 200 mg/dL (ref 0–200)
HDL: 41.4 mg/dL (ref >39.00)
LDL CALC: 143 mg/dL — AB (ref 0–99)
NONHDL: 158.81
Triglycerides: 78 mg/dL (ref 0.0–149.0)
VLDL: 15.6 mg/dL (ref 0.0–40.0)

## 2018-07-04 LAB — COMPREHENSIVE METABOLIC PANEL
ALT: 17 U/L (ref 0–35)
AST: 18 U/L (ref 0–37)
Albumin: 4.6 g/dL (ref 3.5–5.2)
Alkaline Phosphatase: 77 U/L (ref 39–117)
BUN: 16 mg/dL (ref 6–23)
CHLORIDE: 103 meq/L (ref 96–112)
CO2: 27 meq/L (ref 19–32)
Calcium: 9.3 mg/dL (ref 8.4–10.5)
Creatinine, Ser: 0.75 mg/dL (ref 0.40–1.20)
GFR: 100.54 mL/min (ref >60.00)
GLUCOSE: 96 mg/dL (ref 70–99)
POTASSIUM: 3.8 meq/L (ref 3.5–5.1)
Sodium: 138 mEq/L (ref 135–145)
Total Bilirubin: 0.4 mg/dL (ref 0.2–1.2)
Total Protein: 7.5 g/dL (ref 6.0–8.3)

## 2018-07-07 ENCOUNTER — Encounter: Payer: Self-pay | Admitting: Primary Care

## 2018-07-07 ENCOUNTER — Encounter: Payer: 59 | Admitting: Primary Care

## 2018-07-07 ENCOUNTER — Ambulatory Visit: Payer: 59 | Admitting: Primary Care

## 2018-07-07 VITALS — BP 114/72 | HR 93 | Temp 98.6°F | Ht 62.0 in | Wt 174.0 lb

## 2018-07-07 DIAGNOSIS — N946 Dysmenorrhea, unspecified: Secondary | ICD-10-CM | POA: Diagnosis not present

## 2018-07-07 DIAGNOSIS — Z0000 Encounter for general adult medical examination without abnormal findings: Secondary | ICD-10-CM | POA: Diagnosis not present

## 2018-07-07 NOTE — Patient Instructions (Addendum)
Continue exercising. You should be getting 150 minutes of moderate intensity exercise weekly.  Continue to work on a healthy diet, you're doing great!  Become familiar with your breasts as discussed. Follow up with gynecology.  We will see you in one year for your annual exam or sooner if needed.  It was a pleasure to see you today!   Preventive Care 18-39 Years, Female Preventive care refers to lifestyle choices and visits with your health care provider that can promote health and wellness. What does preventive care include?   A yearly physical exam. This is also called an annual well check.  Dental exams once or twice a year.  Routine eye exams. Ask your health care provider how often you should have your eyes checked.  Personal lifestyle choices, including: ? Daily care of your teeth and gums. ? Regular physical activity. ? Eating a healthy diet. ? Avoiding tobacco and drug use. ? Limiting alcohol use. ? Practicing safe sex. ? Taking vitamin and mineral supplements as recommended by your health care provider. What happens during an annual well check? The services and screenings done by your health care provider during your annual well check will depend on your age, overall health, lifestyle risk factors, and family history of disease. Counseling Your health care provider may ask you questions about your:  Alcohol use.  Tobacco use.  Drug use.  Emotional well-being.  Home and relationship well-being.  Sexual activity.  Eating habits.  Work and work Statistician.  Method of birth control.  Menstrual cycle.  Pregnancy history. Screening You may have the following tests or measurements:  Height, weight, and BMI.  Diabetes screening. This is done by checking your blood sugar (glucose) after you have not eaten for a while (fasting).  Blood pressure.  Lipid and cholesterol levels. These may be checked every 5 years starting at age 104.  Skin  check.  Hepatitis C blood test.  Hepatitis B blood test.  Sexually transmitted disease (STD) testing.  BRCA-related cancer screening. This may be done if you have a family history of breast, ovarian, tubal, or peritoneal cancers.  Pelvic exam and Pap test. This may be done every 3 years starting at age 37. Starting at age 67, this may be done every 5 years if you have a Pap test in combination with an HPV test. Discuss your test results, treatment options, and if necessary, the need for more tests with your health care provider. Vaccines Your health care provider may recommend certain vaccines, such as:  Influenza vaccine. This is recommended every year.  Tetanus, diphtheria, and acellular pertussis (Tdap, Td) vaccine. You may need a Td booster every 10 years.  Varicella vaccine. You may need this if you have not been vaccinated.  HPV vaccine. If you are 49 or younger, you may need three doses over 6 months.  Measles, mumps, and rubella (MMR) vaccine. You may need at least one dose of MMR. You may also need a second dose.  Pneumococcal 13-valent conjugate (PCV13) vaccine. You may need this if you have certain conditions and were not previously vaccinated.  Pneumococcal polysaccharide (PPSV23) vaccine. You may need one or two doses if you smoke cigarettes or if you have certain conditions.  Meningococcal vaccine. One dose is recommended if you are age 72-21 years and a first-year college student living in a residence hall, or if you have one of several medical conditions. You may also need additional booster doses.  Hepatitis A vaccine. You may need this  if you have certain conditions or if you travel or work in places where you may be exposed to hepatitis A.  Hepatitis B vaccine. You may need this if you have certain conditions or if you travel or work in places where you may be exposed to hepatitis B.  Haemophilus influenzae type b (Hib) vaccine. You may need this if you have  certain risk factors. Talk to your health care provider about which screenings and vaccines you need and how often you need them. This information is not intended to replace advice given to you by your health care provider. Make sure you discuss any questions you have with your health care provider. Document Released: 08/28/2001 Document Revised: 02/12/2017 Document Reviewed: 05/03/2015 Elsevier Interactive Patient Education  2019 [employer].

## 2018-07-07 NOTE — Progress Notes (Signed)
Subjective:    Patient ID: Ashlee Cross, female    DOB: 11-21-1995, 22 y.o.   MRN: [id number]  HPI  [name] is a 22 year old female who presents today for complete physical.  Immunizations: -Tetanus: Completed in 2017 -Influenza: Completed this season -HPV: Completed  Diet: She endorses a healthy diet Breakfast: English muffin with peanut butter Lunch: Protein, vegetables, potatoes  Dinner: Protein, vegetables, potatoes Snacks: Fruit, trail mix Desserts: None Beverages: Coffee, water, beer on the weekends  Exercise: She is exercising 6 days weekly Eye exam: Completed in 2019 Dental exam: Completes annually  Pap Smear: Completed in 2018   Review of Systems  Constitutional: Negative for unexpected weight change.  HENT: Negative for rhinorrhea.   Respiratory: Negative for cough and shortness of breath.   Cardiovascular: Negative for chest pain.  Gastrointestinal: Negative for constipation and diarrhea.  Genitourinary: Negative for difficulty urinating and menstrual problem.  Musculoskeletal: Negative for arthralgias and myalgias.  Skin: Negative for rash.       Sweating under axilla frequently  Allergic/Immunologic: Negative for environmental allergies.  Neurological: Negative for dizziness, numbness and headaches.  Psychiatric/Behavioral: The patient is not nervous/anxious.        No past medical history on file.   Social History   Socioeconomic History  . Marital status: Single    Spouse name: Not on file  . Number of children: Not on file  . Years of education: Not on file  . Highest education level: Not on file  Occupational History  . Not on file  Social Needs  . Financial resource strain: Not on file  . Food insecurity:    Worry: Not on file    Inability: Not on file  . Transportation needs:    Medical: Not on file    Non-medical: Not on file  Tobacco Use  . Smoking status: Never Smoker  . Smokeless tobacco: Never Used  Substance and  Sexual Activity  . Alcohol use: Yes    Alcohol/week: 1.0 standard drinks    Types: 1 Glasses of wine per week  . Drug use: No  . Sexual activity: Never    Birth control/protection: Pill, Abstinence  Lifestyle  . Physical activity:    Days per week: Not on file    Minutes per session: Not on file  . Stress: Not on file  Relationships  . Social connections:    Talks on phone: Not on file    Gets together: Not on file    Attends religious service: Not on file    Active member of club or organization: Not on file    Attends meetings of clubs or organizations: Not on file    Relationship status: Not on file  . Intimate partner violence:    Fear of current or ex partner: Not on file    Emotionally abused: Not on file    Physically abused: Not on file    Forced sexual activity: Not on file  Other Topics Concern  . Not on file  Social History Narrative   Single.   Bachelor's degree in Bioengineering.    Plans on attending PT school in the Fall of 2018.   Enjoys spending time with friends, [place], [place].    Past Surgical History:  Procedure Laterality Date  . WISDOM TOOTH EXTRACTION  2014    Family History  Problem Relation Age of Onset  . Hyperlipidemia Father   . Hyperlipidemia Mother   . Cancer Mother  squamous cell carcinoma    Allergies  Allergen Reactions  . Vicodin [Hydrocodone-Acetaminophen] Nausea And Vomiting    Current Outpatient Medications on File Prior to Visit  Medication Sig Dispense Refill  . levonorgestrel (MIRENA) 20 MCG/24HR IUD 1 each by Intrauterine route once.     No current facility-administered medications on file prior to visit.     BP 114/72   Pulse 93   Temp 98.6 F (37 C) (Oral)   Ht 5\' 2"  (1.575 m)   Wt 174 lb (78.9 kg)   SpO2 98%   BMI 31.83 kg/m    Objective:   Physical Exam  Constitutional: She is oriented to person, place, and time. She appears well-nourished.  HENT:  Mouth/Throat: No oropharyngeal  exudate.  Eyes: Pupils are equal, round, and reactive to light. EOM are normal.  Neck: Neck supple. No thyromegaly present.  Cardiovascular: Normal rate and regular rhythm.  Respiratory: Effort normal and breath sounds normal.  GI: Soft. Bowel sounds are normal. There is no abdominal tenderness.  Musculoskeletal: Normal range of motion.  Neurological: She is alert and oriented to person, place, and time.  Skin: Skin is warm and dry.  Psychiatric: She has a normal mood and affect.           Assessment & Plan:

## 2018-07-07 NOTE — Assessment & Plan Note (Signed)
Improved with IUD, following with GYN.

## 2018-07-07 NOTE — Assessment & Plan Note (Signed)
Immunizations UTD. Pap smear UTD, follows with GYN. Commended her on regular exercise and a healthy diet. Encouraged to continue. Exam unremarkable. Labs pending. Follow up in 1 year for CPE.

## 2018-12-16 NOTE — Telephone Encounter (Signed)
Letter printed. Ashlee Cross, will you please mail this to her address as requested? Thanks.

## 2018-12-17 NOTE — Telephone Encounter (Signed)
Done.   Mailed out

## 2019-02-27 NOTE — Telephone Encounter (Signed)
Form completed and placed in [facility] in box.  Patient needs [facility] eye exam.

## 2019-03-02 NOTE — Telephone Encounter (Signed)
Eye exam as been completed.

## 2019-08-11 ENCOUNTER — Other Ambulatory Visit: Payer: Self-pay

## 2019-08-11 ENCOUNTER — Ambulatory Visit (INDEPENDENT_AMBULATORY_CARE_PROVIDER_SITE_OTHER): Payer: BC Managed Care – PPO | Admitting: Primary Care

## 2019-08-11 ENCOUNTER — Encounter: Payer: Self-pay | Admitting: Primary Care

## 2019-08-11 VITALS — BP 122/78 | HR 80 | Temp 96.8°F | Ht 62.0 in | Wt 166.8 lb

## 2019-08-11 DIAGNOSIS — M25512 Pain in left shoulder: Secondary | ICD-10-CM | POA: Diagnosis not present

## 2019-08-11 DIAGNOSIS — R450 Nervousness: Secondary | ICD-10-CM | POA: Diagnosis not present

## 2019-08-11 DIAGNOSIS — Z0000 Encounter for general adult medical examination without abnormal findings: Secondary | ICD-10-CM

## 2019-08-11 DIAGNOSIS — Z0001 Encounter for general adult medical examination with abnormal findings: Secondary | ICD-10-CM | POA: Diagnosis not present

## 2019-08-11 DIAGNOSIS — N946 Dysmenorrhea, unspecified: Secondary | ICD-10-CM | POA: Diagnosis not present

## 2019-08-11 DIAGNOSIS — H6121 Impacted cerumen, right ear: Secondary | ICD-10-CM | POA: Diagnosis not present

## 2019-08-11 DIAGNOSIS — R42 Dizziness and giddiness: Secondary | ICD-10-CM | POA: Diagnosis not present

## 2019-08-11 DIAGNOSIS — G8929 Other chronic pain: Secondary | ICD-10-CM

## 2019-08-11 LAB — LIPID PANEL
Cholesterol: 206 mg/dL — ABNORMAL HIGH (ref 0–200)
HDL: 40 mg/dL (ref >39.00)
LDL Cholesterol: 150 mg/dL — ABNORMAL HIGH (ref 0–99)
NonHDL: 165.61
Total CHOL/HDL Ratio: 5
Triglycerides: 77 mg/dL (ref 0.0–149.0)
VLDL: 15.4 mg/dL (ref 0.0–40.0)

## 2019-08-11 LAB — CBC
HCT: 39.2 % (ref 36.0–46.0)
Hemoglobin: 13.4 g/dL (ref 12.0–15.0)
MCHC: 34.3 g/dL (ref 30.0–36.0)
MCV: 90.3 fl (ref 78.0–100.0)
Platelets: 189 10*3/uL (ref 150.0–400.0)
RBC: 4.34 Mil/uL (ref 3.87–5.11)
RDW: 13.3 % (ref 11.5–15.5)
WBC: 6.2 10*3/uL (ref 4.0–10.5)

## 2019-08-11 LAB — COMPREHENSIVE METABOLIC PANEL
ALT: 10 U/L (ref 0–35)
AST: 16 U/L (ref 0–37)
Albumin: 4.6 g/dL (ref 3.5–5.2)
Alkaline Phosphatase: 74 U/L (ref 39–117)
BUN: 13 mg/dL (ref 6–23)
CO2: 29 mEq/L (ref 19–32)
Calcium: 9.4 mg/dL (ref 8.4–10.5)
Chloride: 102 mEq/L (ref 96–112)
Creatinine, Ser: 0.74 mg/dL (ref 0.40–1.20)
GFR: 95.22 mL/min (ref >60.00)
Glucose, Bld: 89 mg/dL (ref 70–99)
Potassium: 4.2 mEq/L (ref 3.5–5.1)
Sodium: 136 mEq/L (ref 135–145)
Total Bilirubin: 0.6 mg/dL (ref 0.2–1.2)
Total Protein: 7.5 g/dL (ref 6.0–8.3)

## 2019-08-11 LAB — TSH: TSH: 2.42 u[IU]/mL (ref 0.35–4.50)

## 2019-08-11 NOTE — Assessment & Plan Note (Addendum)
With near syncope x 1 year. Doesn't seem to be secondary to dehydration.  ECG today with rate of 77. No ST changes, t-wave inversion, PAC/PVC. No prior ECG to compare. Orthostatic vitals negative.  Labs pending. Referral placed to cardiology for further evaluation.

## 2019-08-11 NOTE — Progress Notes (Signed)
Subjective:    Patient ID: Ashlee Cross, female    DOB: October 13, 1995, 24 y.o.   MRN: [id number]  HPI  This visit occurred during the SARS-CoV-2 public health emergency.  Safety protocols were in place, including screening questions prior to the visit, additional usage of staff PPE, and extensive cleaning of exam room while observing appropriate contact time as indicated for disinfecting solutions.   Ashlee Cross is a 24 year old female who presents today for complete physical.  She would also like to discuss postural dizziness with other symptoms that began one year ago, worse over the last 5 months. She experiences blacking out sensation episodes with dizziness and nausea when standing up from a leaning forward position, shaving in the shower, and when standing without locking her knees. She's tried eating without improvement. She won't have improvement until she lays flat with her feet elevated in a 90 degree position. She is drinking 64 ounces of water daily. She has no symptoms when exercising.  Immunizations: -Tetanus: Completed in 2017 -Influenza: Completed this season   Diet: She endorsees a healthy diet. Exercise: She started exercising this week  Eye exam: Completed yesterday Dental exam: Scheduled for tomorrow  Pap Smear: Scheduled for tomorrow per GYN  BP Readings from Last 3 Encounters:  08/11/19 122/78  07/07/18 114/72  07/05/17 116/70     Review of Systems  Constitutional: Negative for unexpected weight change.  HENT: Negative for rhinorrhea.   Respiratory: Negative for cough and shortness of breath.   Cardiovascular: Negative for chest pain.  Gastrointestinal: Negative for constipation and diarrhea.  Genitourinary: Negative for difficulty urinating and menstrual problem.  Musculoskeletal: Negative for arthralgias and myalgias.  Skin: Negative for rash.  Allergic/Immunologic: Negative for environmental allergies.  Neurological: Positive for dizziness.  Negative for numbness and headaches.       See HPI  Psychiatric/Behavioral: The patient is not nervous/anxious.        No past medical history on file.   Social History   Socioeconomic History  . Marital status: Single    Spouse name: Not on file  . Number of children: Not on file  . Years of education: Not on file  . Highest education level: Not on file  Occupational History  . Not on file  Tobacco Use  . Smoking status: Never Smoker  . Smokeless tobacco: Never Used  Substance and Sexual Activity  . Alcohol use: Yes    Alcohol/week: 1.0 standard drinks    Types: 1 Glasses of wine per week  . Drug use: No  . Sexual activity: Never    Birth control/protection: Pill, Abstinence  Other Topics Concern  . Not on file  Social History Narrative   Single.   Bachelor's degree in Bioengineering.    Plans on attending PT school in the Fall of 2018.   Enjoys spending time with friends, going to [employer], hiking.   Social Determinants of Health   Financial Resource Strain:   . Difficulty of Paying Living Expenses: Not on file  Food Insecurity:   . Worried About Programme researcher, broadcasting/film/video in the Last Year: Not on file  . Ran Out of Food in the Last Year: Not on file  Transportation Needs:   . Lack of Transportation (Medical): Not on file  . Lack of Transportation (Non-Medical): Not on file  Physical Activity:   . Days of Exercise per Week: Not on file  . Minutes of Exercise per Session: Not on file  Stress:   .  Feeling of Stress : Not on file  Social Connections:   . Frequency of Communication with Friends and Family: Not on file  . Frequency of Social Gatherings with Friends and Family: Not on file  . Attends Religious Services: Not on file  . Active Member of Clubs or Organizations: Not on file  . Attends Archivist Meetings: Not on file  . Marital Status: Not on file  Intimate Partner Violence:   . Fear of Current or Ex-Partner: Not on file  . Emotionally Abused:  Not on file  . Physically Abused: Not on file  . Sexually Abused: Not on file    Past Surgical History:  Procedure Laterality Date  . WISDOM TOOTH EXTRACTION  2014    Family History  Problem Relation Age of Onset  . Hyperlipidemia Father   . Hyperlipidemia Mother   . Cancer Mother        squamous cell carcinoma    Allergies  Allergen Reactions  . Vicodin [Hydrocodone-Acetaminophen] Nausea And Vomiting    Current Outpatient Medications on File Prior to Visit  Medication Sig Dispense Refill  . levonorgestrel (MIRENA) 20 MCG/24HR IUD 1 each by Intrauterine route once.     No current facility-administered medications on file prior to visit.    BP 122/78   Pulse 80   Temp (!) 96.8 F (36 C) (Temporal)   Ht 5\' 2"  (1.575 m)   Wt 166 lb 12 oz (75.6 kg)   SpO2 98%   BMI 30.50 kg/m    Objective:   Physical Exam  Constitutional: She is oriented to person, place, and time. She appears well-nourished.  HENT:  Right Ear: Tympanic membrane and ear canal normal.  Left Ear: Tympanic membrane and ear canal normal.  Mouth/Throat: Oropharynx is clear and moist.  Right canal with non impacting cerumen. Removed with instrumentation without difficulty. TM's and canals unremarkable post removal.   Eyes: Pupils are equal, round, and reactive to light. EOM are normal.  Cardiovascular: Normal rate and regular rhythm.  Respiratory: Effort normal and breath sounds normal.  GI: Soft. Bowel sounds are normal. There is no abdominal tenderness.  Musculoskeletal:        General: Normal range of motion.     Cervical back: Neck supple.  Neurological: She is alert and oriented to person, place, and time. No cranial nerve deficit.  Reflex Scores:      Patellar reflexes are 2+ on the right side and 2+ on the left side. Skin: Skin is warm and dry.  Psychiatric: She has a normal mood and affect.           Assessment & Plan:

## 2019-08-11 NOTE — Patient Instructions (Signed)
Stop by the lab prior to leaving today. I will notify you of your results once received.   You will be contacted regarding your referral to Cardiology.  Please let us know if you have not been contacted within two weeks.   Ensure you are consuming 64 ounces of water daily.  Continue exercising. You should be getting 150 minutes of moderate intensity exercise weekly.  It was a pleasure to see you today!   Preventive Care 78-26 Years Old, Female Preventive care refers to visits with your health care provider and lifestyle choices that can promote health and wellness. This includes:  A yearly physical exam. This may also be called an annual well check.  Regular dental visits and eye exams.  Immunizations.  Screening for certain conditions.  Healthy lifestyle choices, such as eating a healthy diet, getting regular exercise, not using drugs or products that contain nicotine and tobacco, and limiting alcohol use. What can I expect for my preventive care visit? Physical exam Your health care provider will check your:  Height and weight. This may be used to calculate body mass index (BMI), which tells if you are at a healthy weight.  Heart rate and blood pressure.  Skin for abnormal spots. Counseling Your health care provider may ask you questions about your:  Alcohol, tobacco, and drug use.  Emotional well-being.  Home and relationship well-being.  Sexual activity.  Eating habits.  Work and work Statistician.  Method of birth control.  Menstrual cycle.  Pregnancy history. What immunizations do I need?  Influenza (flu) vaccine  This is recommended every year. Tetanus, diphtheria, and pertussis (Tdap) vaccine  You may need a Td booster every 10 years. Varicella (chickenpox) vaccine  You may need this if you have not been vaccinated. Human papillomavirus (HPV) vaccine  If recommended by your health care provider, you may need three doses over 6 months. Measles,  mumps, and rubella (MMR) vaccine  You may need at least one dose of MMR. You may also need a second dose. Meningococcal conjugate (MenACWY) vaccine  One dose is recommended if you are age 71-21 years and a first-year college student living in a residence hall, or if you have one of several medical conditions. You may also need additional booster doses. Pneumococcal conjugate (PCV13) vaccine  You may need this if you have certain conditions and were not previously vaccinated. Pneumococcal polysaccharide (PPSV23) vaccine  You may need one or two doses if you smoke cigarettes or if you have certain conditions. Hepatitis A vaccine  You may need this if you have certain conditions or if you travel or work in places where you may be exposed to hepatitis A. Hepatitis B vaccine  You may need this if you have certain conditions or if you travel or work in places where you may be exposed to hepatitis B. Haemophilus influenzae type b (Hib) vaccine  You may need this if you have certain conditions. You may receive vaccines as individual doses or as more than one vaccine together in one shot (combination vaccines). Talk with your health care provider about the risks and benefits of combination vaccines. What tests do I need?  Blood tests  Lipid and cholesterol levels. These may be checked every 5 years starting at age 63.  Hepatitis C test.  Hepatitis B test. Screening  Diabetes screening. This is done by checking your blood sugar (glucose) after you have not eaten for a while (fasting).  Sexually transmitted disease (STD) testing.  BRCA-related cancer  screening. This may be done if you have a family history of breast, ovarian, tubal, or peritoneal cancers.  Pelvic exam and Pap test. This may be done every 3 years starting at age 51. Starting at age 73, this may be done every 5 years if you have a Pap test in combination with an HPV test. Talk with your health care provider about your test  results, treatment options, and if necessary, the need for more tests. Follow these instructions at home: Eating and drinking   Eat a diet that includes fresh fruits and vegetables, whole grains, lean protein, and low-fat dairy.  Take vitamin and mineral supplements as recommended by your health care provider.  Do not drink alcohol if: ? Your health care provider tells you not to drink. ? You are pregnant, may be pregnant, or are planning to become pregnant.  If you drink alcohol: ? Limit how much you have to 0-1 drink a day. ? Be aware of how much alcohol is in your drink. In the [place] one drink equals one 12 oz bottle of beer (355 mL), one 5 oz glass of wine (148 mL), or one 1 oz glass of hard liquor (44 mL). Lifestyle  Take daily care of your teeth and gums.  Stay active. Exercise for at least 30 minutes on 5 or more days each week.  Do not use any products that contain nicotine or tobacco, such as cigarettes, e-cigarettes, and chewing tobacco. If you need help quitting, ask your health care provider.  If you are sexually active, practice safe sex. Use a condom or other form of birth control (contraception) in order to prevent pregnancy and STIs (sexually transmitted infections). If you plan to become pregnant, see your health care provider for a preconception visit. What's next?  Visit your health care provider once a year for a well check visit.  Ask your health care provider how often you should have your eyes and teeth checked.  Stay up to date on all vaccines. This information is not intended to replace advice given to you by your health care provider. Make sure you discuss any questions you have with your health care provider. Document Revised: 03/13/2018 Document Reviewed: 03/13/2018 Elsevier Patient Education  2020 [employer].

## 2019-08-11 NOTE — Assessment & Plan Note (Signed)
Tetanus and influenza vaccines UTD. Pap smear scheduled for tomorrow. Encouraged a healthy diet, regular exercise. Exam today stable. Labs pending.

## 2019-08-11 NOTE — Assessment & Plan Note (Signed)
Significantly improved with IUD.

## 2019-08-11 NOTE — Assessment & Plan Note (Signed)
Infrequent overall. Continue to monitor.  

## 2019-08-11 NOTE — Assessment & Plan Note (Signed)
Improved with regular exercise.

## 2019-08-12 ENCOUNTER — Ambulatory Visit (INDEPENDENT_AMBULATORY_CARE_PROVIDER_SITE_OTHER): Payer: BC Managed Care – PPO | Admitting: Family Medicine

## 2019-08-12 ENCOUNTER — Encounter: Payer: Self-pay | Admitting: Family Medicine

## 2019-08-12 VITALS — BP 121/82 | HR 72 | Wt 167.0 lb

## 2019-08-12 DIAGNOSIS — Z30431 Encounter for routine checking of intrauterine contraceptive device: Secondary | ICD-10-CM | POA: Diagnosis not present

## 2019-08-12 DIAGNOSIS — Z1151 Encounter for screening for human papillomavirus (HPV): Secondary | ICD-10-CM

## 2019-08-12 DIAGNOSIS — Z8489 Family history of other specified conditions: Secondary | ICD-10-CM | POA: Insufficient documentation

## 2019-08-12 DIAGNOSIS — Z01419 Encounter for gynecological examination (general) (routine) without abnormal findings: Secondary | ICD-10-CM

## 2019-08-12 DIAGNOSIS — Z124 Encounter for screening for malignant neoplasm of cervix: Secondary | ICD-10-CM | POA: Diagnosis not present

## 2019-08-12 NOTE — Progress Notes (Signed)
Subjective:     Ashlee Cross is a 24 y.o. female here at [facility] for a routine exam.  Current complaints: none.    Patient has a primary care provider. She has no complaints, and is here for a pap test. Her last pap test was over four years ago, with normal findings. She is not sexually active, and has had negative G/C testing since she was sexually active. She has been amenorrheic since her Mirena IUD placement in 2018.   Her mother was recently diagnosed with breast cancer at the age of 85, and she has a first cousin on her mothers side who was diagnosed with DCIS at the age of 28. Her mother did not meet criteria for BRCA genetic testing, and the patient does not wish to undergo genetic testing at this time. She is anxious about breast cancer and interested in knowing what screening measures are appropriate for her moving forward.  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    Gynecologic History Patient's last menstrual period was 08/05/2019. Contraception: IUD Last Pap: 2017. Results were: normal Last mammogram: NA.   Obstetric History OB History  Gravida Para Term Preterm AB Living  0 0 0 0 0 0  SAB TAB Ectopic Multiple Live Births  0 0 0 0 0    Review of Systems Pertinent items noted in HPI and remainder of comprehensive ROS otherwise negative.    Objective:   Physical Exam  Constitutional: well developed, well nourished, no distress HEENT: normocephalic Pulm/chest wall: normal effort Abdomen: soft Neuro: alert and oriented x 3 Skin: warm, dry Psych: affect normal Breast: No masses, tenderness, asymmetry, nipple discharge or axillary lymphadenopathy GU: Normally developed external female genitalia with no external lesions or eruptions, vagina and cervix have no lesions, inflammation, or tenderness. Normal, scant white discharge. IUD strings visualized     Assessment/Plan:   1. Well woman exam with routine gynecological exam Patient has  not been sexually active for several years and declines G/C testing given that she has had a negative test within that time period.  - Cytology - PAP( [facility])  2. Encounter for routine checking of intrauterine contraceptive device (IUD) IUD strings visualized on physical exam. Placed in 2018. Patient has no concerns.   3. Family history of carcinoma in situ of breast Patient's mother diagnosed at age 3 and maternal first cousin at 64. Does not request genetic testing at this time. Counseled patient on self breast exams and starting screening mammograms at age 23. Patient counseled on effects of breast reduction surgery and effect on future breast cancer risk, lactation. Patient indicated understanding and will call if concerned about any findings in the future.   Follow up in: 1 year or as needed.   Phill Mutter, Medical Student 2:30 PM

## 2019-08-12 NOTE — Progress Notes (Signed)
Mom dx with breast cancer at age 26. She would like to discuss her options and be referred to genetics provider. Patient reports insurance will not pay for testing due to mother age of diagnosis.

## 2019-08-12 NOTE — Progress Notes (Deleted)
   GYNECOLOGY ANNUAL PREVENTATIVE CARE ENCOUNTER NOTE  Subjective:   Ashlee Cross is a 24 y.o. G0P0000 female here for a routine annual gynecologic exam.  Current complaints: Reports no new sexual partners in 2-3 years. Mother has BC at 46 and cousin at 72. Cousin had an aggressive DCS. Considering breast reduction in the future.   Denies abnormal vaginal bleeding, discharge, pelvic pain, problems with intercourse or other gynecologic concerns.    Gynecologic History Patient's last menstrual period was 08/05/2019. Contraception: IUD Last Pap: 2018. Results were: normal Last mammogram: NA  The following portions of the patient's history were reviewed and updated as appropriate: allergies, current medications, past family history, past medical history, past social history, past surgical history and problem list.  Review of Systems Pertinent items are noted in HPI.   Objective:  BP 121/82   Pulse 72   Wt 167 lb (75.8 kg)   LMP 08/05/2019   BMI 30.54 kg/m  CONSTITUTIONAL: Well-developed, well-nourished female in no acute distress.  HENT:  Normocephalic, atraumatic, External right and left ear normal. Oropharynx is clear and moist EYES:  No scleral icterus.  NECK: Normal range of motion, supple, no masses.  Normal thyroid.  SKIN: Skin is warm and dry. No rash noted. Not diaphoretic. No erythema. No pallor. NEUROLOGIC: Alert and oriented to person, place, and time. Normal reflexes, muscle tone coordination. No cranial nerve deficit noted. PSYCHIATRIC: Normal mood and affect. Normal behavior. Normal judgment and thought content. CARDIOVASCULAR: Normal heart rate noted, regular rhythm. 2+ distal pulses. RESPIRATORY: Effort and breath sounds normal, no problems with respiration noted. BREASTS: Symmetric in size. No masses, skin changes, nipple drainage, or lymphadenopathy. ABDOMEN: Soft,  no distention noted.  No tenderness, rebound or guarding.  PELVIC: Normal appearing external  genitalia; normal appearing vaginal mucosa and cervix.  No abnormal discharge noted. Pap smear obtained. IUD strings visible. Normal uterine size, no other palpable masses, no uterine or adnexal tenderness. MUSCULOSKELETAL: Normal range of motion.    Assessment and Plan:  1) Annual gynecologic examination with pap smear:  Will follow up results of pap smear and manage accordingly. Declines STI screen.  Routine preventative health maintenance measures emphasized.  2) Contraception counseling: Reviewed all forms of birth control options available including abstinence; over the counter/barrier methods; hormonal contraceptive medication including pill, patch, ring, injection,contraceptive implant; hormonal and nonhormonal IUDs; permanent sterilization options including vasectomy and the various tubal sterilization modalities. Risks and benefits reviewed.  Questions were answered.  Written information was also given to the patient to review.  Patient desires IUD, this was prescribed for patient. She will follow up in 48yr for surveillance.  She was told to call with any further questions, or with any concerns about this method of contraception.  Emphasized use of condoms 100% of the time for STI prevention.   Please refer to After Visit Summary for other counseling recommendations.   Return in about 1 year (around 08/11/2020) for Yearly wellness exam.  Federico Flake, MD, MPH, ABFM Attending Physician Center for [facility]

## 2019-08-14 LAB — CYTOLOGY - PAP
Comment: NEGATIVE
Diagnosis: NEGATIVE
High risk HPV: NEGATIVE

## 2019-08-17 NOTE — Telephone Encounter (Signed)
Patient returned your call  Patient would like a [place] location if at all possible due to her living there  Patient also stated that she would like Monday or Wednesday early as possible  OR Tuesday, Thursday, Friday After 4:00pm

## 2019-08-24 ENCOUNTER — Encounter: Payer: Self-pay | Admitting: Radiology

## 2019-09-24 DIAGNOSIS — R42 Dizziness and giddiness: Secondary | ICD-10-CM | POA: Diagnosis not present

## 2019-09-24 DIAGNOSIS — Z6829 Body mass index (BMI) 29.0-29.9, adult: Secondary | ICD-10-CM | POA: Diagnosis not present

## 2019-10-01 DIAGNOSIS — M25562 Pain in left knee: Secondary | ICD-10-CM | POA: Diagnosis not present

## 2019-10-10 DIAGNOSIS — M25562 Pain in left knee: Secondary | ICD-10-CM | POA: Diagnosis not present

## 2019-10-29 DIAGNOSIS — M25562 Pain in left knee: Secondary | ICD-10-CM | POA: Diagnosis not present

## 2020-05-24 ENCOUNTER — Encounter: Payer: Self-pay | Admitting: Radiology
